# Patient Record
Sex: Female | Born: 1990 | Race: Black or African American | Hispanic: Yes | Marital: Single | Smoking: Never smoker
Health system: Southern US, Community
[De-identification: ages and names within clinical notes are randomized; demographics above are authoritative.]

---
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
## 2009-12-26 ENCOUNTER — Emergency Department (HOSPITAL_COMMUNITY): Admission: EM | Admit: 2009-12-26 | Discharge: 2009-12-26 | Payer: Self-pay | Admitting: Emergency Medicine

## 2011-09-09 LAB — I-STAT 8, (EC8 V) (CONVERTED LAB)
Acid-base deficit: 1
Bicarbonate: 24.7 — ABNORMAL HIGH
Chloride: 105
HCT: 48
Operator id: 161631
TCO2: 26
pCO2, Ven: 43.7 — ABNORMAL LOW

## 2011-09-09 LAB — CBC
HCT: 41.9
Hemoglobin: 14
MCHC: 33.4
RDW: 12.4

## 2011-09-09 LAB — DIFFERENTIAL
Basophils Absolute: 0.1
Eosinophils Relative: 1
Lymphocytes Relative: 14 — ABNORMAL LOW
Monocytes Absolute: 0.7

## 2011-09-09 LAB — URINE MICROSCOPIC-ADD ON

## 2011-09-09 LAB — URINALYSIS, ROUTINE W REFLEX MICROSCOPIC
Glucose, UA: NEGATIVE
Hgb urine dipstick: NEGATIVE
Protein, ur: >300 — AB

## 2015-02-09 ENCOUNTER — Emergency Department (INDEPENDENT_AMBULATORY_CARE_PROVIDER_SITE_OTHER)
Admission: EM | Admit: 2015-02-09 | Discharge: 2015-02-09 | Disposition: A | Discharge status: Home / Self Care | Payer: Self-pay | Source: Home / Self Care | Attending: Family Medicine | Admitting: Family Medicine

## 2015-02-09 ENCOUNTER — Encounter (HOSPITAL_COMMUNITY): Payer: Self-pay | Admitting: Emergency Medicine

## 2015-02-09 DIAGNOSIS — T148 Other injury of unspecified body region: Secondary | ICD-10-CM

## 2015-02-09 DIAGNOSIS — S46811A Strain of other muscles, fascia and tendons at shoulder and upper arm level, right arm, initial encounter: Secondary | ICD-10-CM

## 2015-02-09 DIAGNOSIS — T148XXA Other injury of unspecified body region, initial encounter: Secondary | ICD-10-CM

## 2015-02-09 MED ORDER — CYCLOBENZAPRINE HCL 5 MG PO TABS: 5.0000 mg | ORAL_TABLET | Freq: Every evening | ORAL | Status: AC | PRN

## 2015-02-09 MED ORDER — DICLOFENAC SODIUM 50 MG PO TBEC: 50.0000 mg | DELAYED_RELEASE_TABLET | Freq: Two times a day (BID) | ORAL | Status: AC | PRN
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
## 2015-02-09 NOTE — Discharge Instructions (Signed)
Thank you for coming in today. The flexeril will make you sleepy.   Cervical Strain and Sprain (Whiplash) with Rehab Cervical strain and sprain are injuries that commonly occur with "whiplash" injuries. Whiplash occurs when the neck is forcefully whipped backward or forward, such as during a motor vehicle accident or during contact sports. The muscles, ligaments, tendons, discs, and nerves of the neck are susceptible to injury when this occurs. RISK FACTORS Risk of having a whiplash injury increases if:  Osteoarthritis of the spine.  Situations that make head or neck accidents or trauma more likely.  High-risk sports (football, rugby, wrestling, hockey, auto racing, gymnastics, diving, contact karate, or boxing).  Poor strength and flexibility of the neck.  Previous neck injury.  Poor tackling technique.  Improperly fitted or padded equipment. SYMPTOMS   Pain or stiffness in the front or back of neck or both.  Symptoms may present immediately or up to 24 hours after injury.  Dizziness, headache, nausea, and vomiting.  Muscle spasm with soreness and stiffness in the neck.  Tenderness and swelling at the injury site. PREVENTION  Learn and use proper technique (avoid tackling with the head, spearing, and head-butting; use proper falling techniques to avoid landing on the head).  Warm up and stretch properly before activity.  Maintain physical fitness:  Strength, flexibility, and endurance.  Cardiovascular fitness.  Wear properly fitted and padded protective equipment, such as padded soft collars, for participation in contact sports. PROGNOSIS  Recovery from cervical strain and sprain injuries is dependent on the extent of the injury. These injuries are usually curable in 1 week to 3 months with appropriate treatment.  RELATED COMPLICATIONS   Temporary numbness and weakness may occur if the nerve roots are damaged, and this may persist until the nerve has completely  healed.  Chronic pain due to frequent recurrence of symptoms.  Prolonged healing, especially if activity is resumed too soon (before complete recovery). TREATMENT  Treatment initially involves the use of ice and medication to help reduce pain and inflammation. It is also important to perform strengthening and stretching exercises and modify activities that worsen symptoms so the injury does not get worse. These exercises may be performed at home or with a therapist. For patients who experience severe symptoms, a soft, padded collar may be recommended to be worn around the neck.  Improving your posture may help reduce symptoms. Posture improvement includes pulling your chin and abdomen in while sitting or standing. If you are sitting, sit in a firm chair with your buttocks against the back of the chair. While sleeping, try replacing your pillow with a small towel rolled to 2 inches in diameter, or use a cervical pillow or soft cervical collar. Poor sleeping positions delay healing.  For patients with nerve root damage, which causes numbness or weakness, the use of a cervical traction apparatus may be recommended. Surgery is rarely necessary for these injuries. However, cervical strain and sprains that are present at birth (congenital) may require surgery. MEDICATION   If pain medication is necessary, nonsteroidal anti-inflammatory medications, such as aspirin and ibuprofen, or other minor pain relievers, such as acetaminophen, are often recommended.  Do not take pain medication for 7 days before surgery.  Prescription pain relievers may be given if deemed necessary by your caregiver. Use only as directed and only as much as you need. HEAT AND COLD:   Cold treatment (icing) relieves pain and reduces inflammation. Cold treatment should be applied for 10 to 15 minutes every 2  to 3 hours for inflammation and pain and immediately after any activity that aggravates your symptoms. Use ice packs or an ice  massage.  Heat treatment may be used prior to performing the stretching and strengthening activities prescribed by your caregiver, physical therapist, or athletic trainer. Use a heat pack or a warm soak. SEEK MEDICAL CARE IF:   Symptoms get worse or do not improve in 2 weeks despite treatment.  New, unexplained symptoms develop (drugs used in treatment may produce side effects). EXERCISES RANGE OF MOTION (ROM) AND STRETCHING EXERCISES - Cervical Strain and Sprain These exercises may help you when beginning to rehabilitate your injury. In order to successfully resolve your symptoms, you must improve your posture. These exercises are designed to help reduce the forward-head and rounded-shoulder posture which contributes to this condition. Your symptoms may resolve with or without further involvement from your physician, physical therapist or athletic trainer. While completing these exercises, remember:   Restoring tissue flexibility helps normal motion to return to the joints. This allows healthier, less painful movement and activity.  An effective stretch should be held for at least 20 seconds, although you may need to begin with shorter hold times for comfort.  A stretch should never be painful. You should only feel a gentle lengthening or release in the stretched tissue. STRETCH- Axial Extensors  Lie on your back on the floor. You may bend your knees for comfort. Place a rolled-up hand towel or dish towel, about 2 inches in diameter, under the part of your head that makes contact with the floor.  Gently tuck your chin, as if trying to make a "double chin," until you feel a gentle stretch at the base of your head.  Hold __________ seconds. Repeat __________ times. Complete this exercise __________ times per day.  STRETCH - Axial Extension   Stand or sit on a firm surface. Assume a good posture: chest up, shoulders drawn back, abdominal muscles slightly tense, knees unlocked (if standing)  and feet hip width apart.  Slowly retract your chin so your head slides back and your chin slightly lowers. Continue to look straight ahead.  You should feel a gentle stretch in the back of your head. Be certain not to feel an aggressive stretch since this can cause headaches later.  Hold for __________ seconds. Repeat __________ times. Complete this exercise __________ times per day. STRETCH - Cervical Side Bend   Stand or sit on a firm surface. Assume a good posture: chest up, shoulders drawn back, abdominal muscles slightly tense, knees unlocked (if standing) and feet hip width apart.  Without letting your nose or shoulders move, slowly tip your right / left ear to your shoulder until your feel a gentle stretch in the muscles on the opposite side of your neck.  Hold __________ seconds. Repeat __________ times. Complete this exercise __________ times per day. STRETCH - Cervical Rotators   Stand or sit on a firm surface. Assume a good posture: chest up, shoulders drawn back, abdominal muscles slightly tense, knees unlocked (if standing) and feet hip width apart.  Keeping your eyes level with the ground, slowly turn your head until you feel a gentle stretch along the back and opposite side of your neck.  Hold __________ seconds. Repeat __________ times. Complete this exercise __________ times per day. RANGE OF MOTION - Neck Circles   Stand or sit on a firm surface. Assume a good posture: chest up, shoulders drawn back, abdominal muscles slightly tense, knees unlocked (if standing) and  feet hip width apart.  Gently roll your head down and around from the back of one shoulder to the back of the other. The motion should never be forced or painful.  Repeat the motion 10-20 times, or until you feel the neck muscles relax and loosen. Repeat __________ times. Complete the exercise __________ times per day. STRENGTHENING EXERCISES - Cervical Strain and Sprain These exercises may help you  when beginning to rehabilitate your injury. They may resolve your symptoms with or without further involvement from your physician, physical therapist, or athletic trainer. While completing these exercises, remember:   Muscles can gain both the endurance and the strength needed for everyday activities through controlled exercises.  Complete these exercises as instructed by your physician, physical therapist, or athletic trainer. Progress the resistance and repetitions only as guided.  You may experience muscle soreness or fatigue, but the pain or discomfort you are trying to eliminate should never worsen during these exercises. If this pain does worsen, stop and make certain you are following the directions exactly. If the pain is still present after adjustments, discontinue the exercise until you can discuss the trouble with your clinician. STRENGTH - Cervical Flexors, Isometric  Face a wall, standing about 6 inches away. Place a small pillow, a ball about 6-8 inches in diameter, or a folded towel between your forehead and the wall.  Slightly tuck your chin and gently push your forehead into the soft object. Push only with mild to moderate intensity, building up tension gradually. Keep your jaw and forehead relaxed.  Hold 10 to 20 seconds. Keep your breathing relaxed.  Release the tension slowly. Relax your neck muscles completely before you start the next repetition. Repeat __________ times. Complete this exercise __________ times per day. STRENGTH- Cervical Lateral Flexors, Isometric   Stand about 6 inches away from a wall. Place a small pillow, a ball about 6-8 inches in diameter, or a folded towel between the side of your head and the wall.  Slightly tuck your chin and gently tilt your head into the soft object. Push only with mild to moderate intensity, building up tension gradually. Keep your jaw and forehead relaxed.  Hold 10 to 20 seconds. Keep your breathing relaxed.  Release the  tension slowly. Relax your neck muscles completely before you start the next repetition. Repeat __________ times. Complete this exercise __________ times per day. STRENGTH - Cervical Extensors, Isometric   Stand about 6 inches away from a wall. Place a small pillow, a ball about 6-8 inches in diameter, or a folded towel between the back of your head and the wall.  Slightly tuck your chin and gently tilt your head back into the soft object. Push only with mild to moderate intensity, building up tension gradually. Keep your jaw and forehead relaxed.  Hold 10 to 20 seconds. Keep your breathing relaxed.  Release the tension slowly. Relax your neck muscles completely before you start the next repetition. Repeat __________ times. Complete this exercise __________ times per day. POSTURE AND BODY MECHANICS CONSIDERATIONS - Cervical Strain and Sprain Keeping correct posture when sitting, standing or completing your activities will reduce the stress put on different body tissues, allowing injured tissues a chance to heal and limiting painful experiences. The following are general guidelines for improved posture. Your physician or physical therapist will provide you with any instructions specific to your needs. While reading these guidelines, remember:  The exercises prescribed by your provider will help you have the flexibility and strength to maintain  correct postures.  The correct posture provides the optimal environment for your joints to work. All of your joints have less wear and tear when properly supported by a spine with good posture. This means you will experience a healthier, less painful body.  Correct posture must be practiced with all of your activities, especially prolonged sitting and standing. Correct posture is as important when doing repetitive low-stress activities (typing) as it is when doing a single heavy-load activity (lifting). PROLONGED STANDING WHILE SLIGHTLY LEANING FORWARD When  completing a task that requires you to lean forward while standing in one place for a long time, place either foot up on a stationary 2- to 4-inch high object to help maintain the best posture. When both feet are on the ground, the low back tends to lose its slight inward curve. If this curve flattens (or becomes too large), then the back and your other joints will experience too much stress, fatigue more quickly, and can cause pain.  RESTING POSITIONS Consider which positions are most painful for you when choosing a resting position. If you have pain with flexion-based activities (sitting, bending, stooping, squatting), choose a position that allows you to rest in a less flexed posture. You would want to avoid curling into a fetal position on your side. If your pain worsens with extension-based activities (prolonged standing, working overhead), avoid resting in an extended position such as sleeping on your stomach. Most people will find more comfort when they rest with their spine in a more neutral position, neither too rounded nor too arched. Lying on a non-sagging bed on your side with a pillow between your knees, or on your back with a pillow under your knees will often provide some relief. Keep in mind, being in any one position for a prolonged period of time, no matter how correct your posture, can still lead to stiffness. WALKING Walk with an upright posture. Your ears, shoulders, and hips should all line up. OFFICE WORK When working at a desk, create an environment that supports good, upright posture. Without extra support, muscles fatigue and lead to excessive strain on joints and other tissues. CHAIR:  A chair should be able to slide under your desk when your back makes contact with the back of the chair. This allows you to work closely.  The chair's height should allow your eyes to be level with the upper part of your monitor and your hands to be slightly lower than your elbows.  Body  position:  Your feet should make contact with the floor. If this is not possible, use a foot rest.  Keep your ears over your shoulders. This will reduce stress on your neck and low back. Document Released: 11/11/2005 Document Revised: 03/28/2014 Document Reviewed: 02/23/2009 [facility] Patient Information 2015 [place], [place]. This information is not intended to replace advice given to you by your health care provider. Make sure you discuss any questions you have with your health care provider.    Abrasions An abrasion is a cut or scrape of the skin. Abrasions do not go through all layers of the skin. HOME CARE  If a bandage (dressing) was put on your wound, change it as told by your doctor. If the bandage sticks, soak it off with warm.  Wash the area with water and soap 2 times a day. Rinse off the soap. Pat the area dry with a clean towel.  Put on medicated cream (ointment) as told by your doctor.  Change your bandage right away if  it gets wet or dirty.  Only take medicine as told by your doctor.  See your doctor within 24-48 hours to get your wound checked.  Check your wound for redness, puffiness (swelling), or yellowish-white fluid (pus). GET HELP RIGHT AWAY IF:   You have more pain in the wound.  You have redness, swelling, or tenderness around the wound.  You have pus coming from the wound.  You have a fever or lasting symptoms for more than 2-3 days.  You have a fever and your symptoms suddenly get worse.  You have a bad smell coming from the wound or bandage. MAKE SURE YOU:   Understand these instructions.  Will watch your condition.  Will get help right away if you are not doing well or get worse. Document Released: 04/29/2008 Document Revised: 08/05/2012 Document Reviewed: 10/15/2011 [facility] Patient Information 2015 [place], [place]. This information is not intended to replace advice given to you by your health care provider. Make sure you discuss any  questions you have with your health care provider.
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
## 2018-02-05 DIAGNOSIS — R1314 Dysphagia, pharyngoesophageal phase: Secondary | ICD-10-CM | POA: Insufficient documentation

## 2018-02-05 DIAGNOSIS — E049 Nontoxic goiter, unspecified: Secondary | ICD-10-CM | POA: Insufficient documentation

## 2019-12-15 ENCOUNTER — Ambulatory Visit: Payer: Medicaid Other | Attending: Internal Medicine

## 2019-12-15 DIAGNOSIS — Z20822 Contact with and (suspected) exposure to covid-19: Secondary | ICD-10-CM

## 2019-12-16 ENCOUNTER — Other Ambulatory Visit: Payer: Self-pay

## 2019-12-16 LAB — NOVEL CORONAVIRUS, NAA: SARS-CoV-2, NAA: NOT DETECTED

## 2020-03-02 ENCOUNTER — Ambulatory Visit (INDEPENDENT_AMBULATORY_CARE_PROVIDER_SITE_OTHER): Payer: Medicaid Other | Admitting: Clinical

## 2020-03-02 ENCOUNTER — Other Ambulatory Visit: Payer: Self-pay

## 2020-03-02 DIAGNOSIS — F331 Major depressive disorder, recurrent, moderate: Secondary | ICD-10-CM

## 2020-03-02 DIAGNOSIS — F419 Anxiety disorder, unspecified: Secondary | ICD-10-CM
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
## 2020-03-30 ENCOUNTER — Other Ambulatory Visit: Payer: Self-pay

## 2020-03-30 ENCOUNTER — Ambulatory Visit (INDEPENDENT_AMBULATORY_CARE_PROVIDER_SITE_OTHER): Payer: Medicaid Other | Admitting: Clinical

## 2020-03-30 DIAGNOSIS — F331 Major depressive disorder, recurrent, moderate: Secondary | ICD-10-CM | POA: Diagnosis not present

## 2020-03-30 DIAGNOSIS — F419 Anxiety disorder, unspecified: Secondary | ICD-10-CM | POA: Diagnosis not present

## 2020-03-30 NOTE — Progress Notes (Signed)
Virtual Visit via Video Note  I connected with Colleen Sparks on 03/30/20 at  1:00 PM EDT by a video enabled telemedicine application and verified that I am speaking with the correct person using two identifiers.  Location: Patient: Home Provider: Office   I discussed the limitations of evaluation and management by telemedicine and the availability of in person appointments. The patient expressed understanding and agreed to proceed.      THERAPIST PROGRESS NOTE  Session Time: 1:00PM-1:45PM  Participation Level: Active  Behavioral Response: CasualAlertDepressed  Type of Therapy: Individual Therapy  Treatment Goals addressed: Coping  Interventions: CBT, DBT, Motivational Interviewing, Solution Focused and Supportive  Summary: Colleen Sparks is a 29 y.o. female who presents with Depression and Anxiety The OPT therapist worked with the patient for her initial session. The OPT therapist utilized Motivational Interviewing to assist in creating therapeutic repore. The patient in the session was engaged and work in collaboration giving feedback about her triggers and symptoms over the past few weeks including feeling overwhelmed with trying to manage her child and in home tasks and balancing this with her own needed individual time. The OPT therapist utilized Cognitive Behavioral Therapy through cognitive restructuring as well as worked with the patient on coping strategies to assist in management of mood and anxiety.    Suicidal/Homicidal: Nowithout intent/plan  Therapist Response: The OPT therapist worked with the patient for the patients initial scheduled session. The patient was engaged in her session and gave feedback in relation to triggers, symptoms, and behavior responses over the past few weeks. The OPT therapist worked with the patient utilizing an in session Cognitive Behavioral Therapy exercise. The patient was responsive in the session and verbalized, " I am willing to try to  be more flexible in my task management and try to implement some me time". The OPT therapist will continue treatment work with the patient in her next scheduled session  Plan: Return again in 2/3 weeks.  Diagnosis: Axis I:Recurrent moderate major depressive disorder with anxiety     Axis II: No diagnosis  I discussed the assessment and treatment plan with the patient. The patient was provided an opportunity to ask questions and all were answered. The patient agreed with the plan and demonstrated an understanding of the instructions.   The patient was advised to call back or seek an in-person evaluation if the symptoms worsen or if the condition fails to improve as anticipated.  I provided 45 minutes of non-face-to-face time during this encounter.  Winfred Burn, LCSW 03/30/2020

## 2020-07-24 ENCOUNTER — Other Ambulatory Visit: Payer: Self-pay | Admitting: Internal Medicine

## 2020-07-24 DIAGNOSIS — E042 Nontoxic multinodular goiter: Secondary | ICD-10-CM

## 2020-08-01 ENCOUNTER — Ambulatory Visit
Admission: RE | Admit: 2020-08-01 | Discharge: 2020-08-01 | Disposition: A | Discharge status: Home / Self Care | Payer: Medicaid Other | Source: Ambulatory Visit | Attending: Internal Medicine | Admitting: Internal Medicine

## 2020-08-01 ENCOUNTER — Other Ambulatory Visit: Payer: Self-pay | Admitting: Internal Medicine

## 2020-08-01 DIAGNOSIS — E042 Nontoxic multinodular goiter: Secondary | ICD-10-CM

## 2020-08-28 ENCOUNTER — Other Ambulatory Visit: Payer: Medicaid Other

## 2020-08-28 DIAGNOSIS — Z20822 Contact with and (suspected) exposure to covid-19: Secondary | ICD-10-CM

## 2020-08-29 LAB — NOVEL CORONAVIRUS, NAA: SARS-CoV-2, NAA: NOT DETECTED

## 2020-08-29 LAB — SARS-COV-2, NAA 2 DAY TAT

## 2021-11-01 IMAGING — US US THYROID
1 series · 13 of 25 positions shown · non-contrast
Comparison: 07/19/2020 and previous

CLINICAL DATA: Bilateral nodules suspected on recent ultrasound.

EXAM:
THYROID ULTRASOUND
TECHNIQUE: Ultrasound examination of the thyroid gland and adjacent soft
tissues was performed in contemplation of FNA biopsy x2.

[Series 1: us thyroid · 0.06mm/px · 13 of 54 slices shown]
[im 1/54]
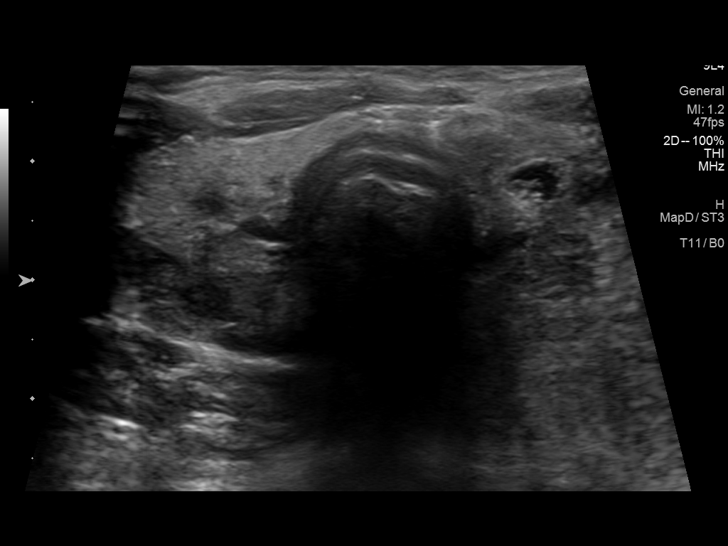
[im 5/54]
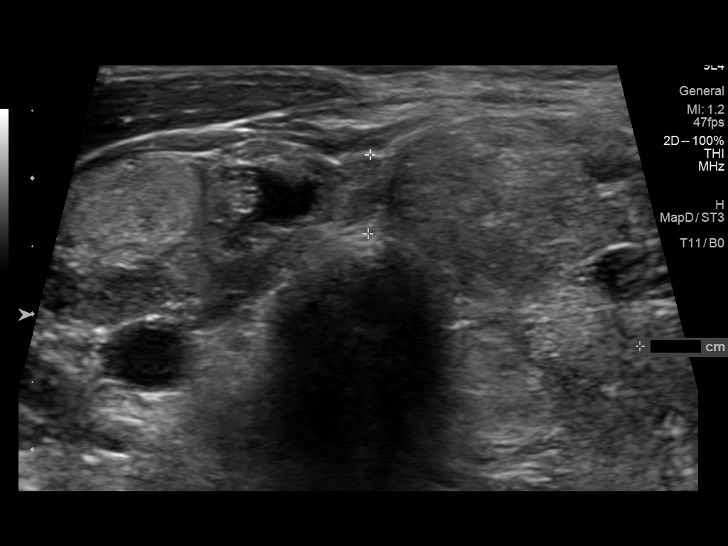
[im 9/54]
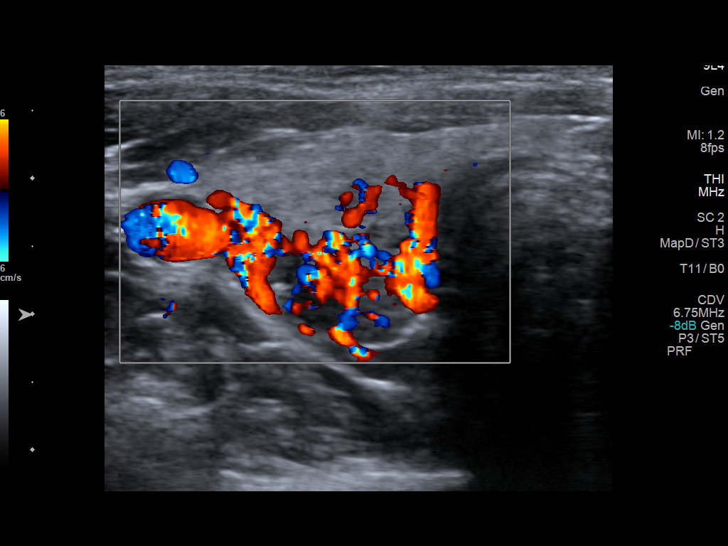
[im 14/54]
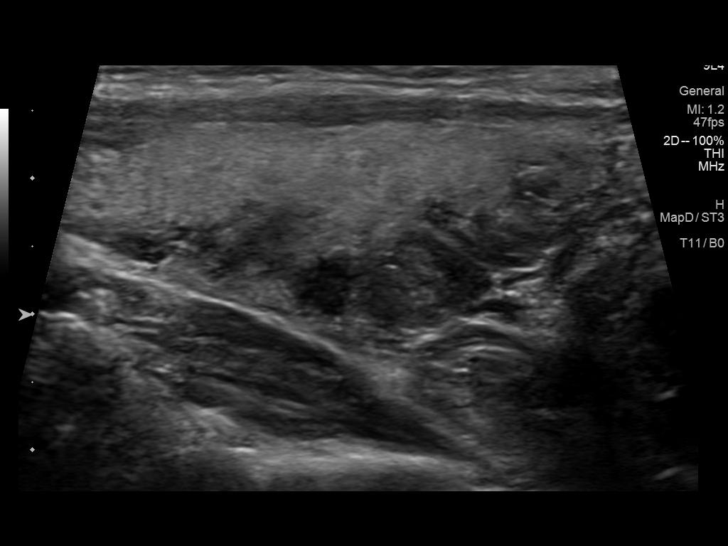
[im 18/54]
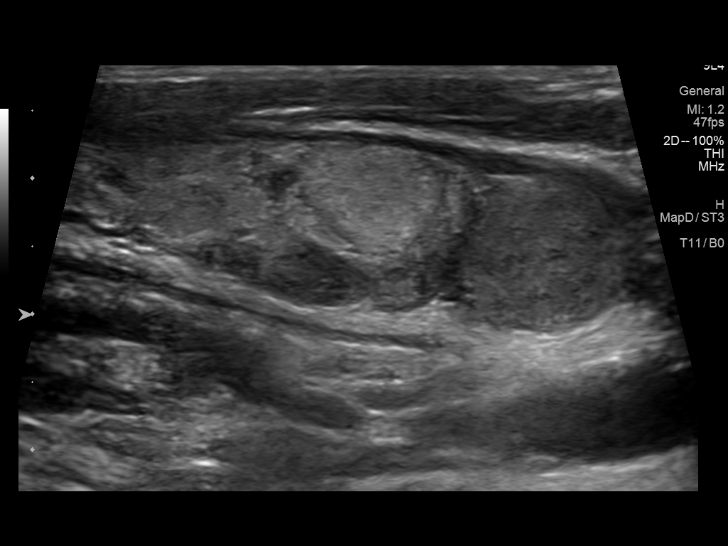
[im 23/54]
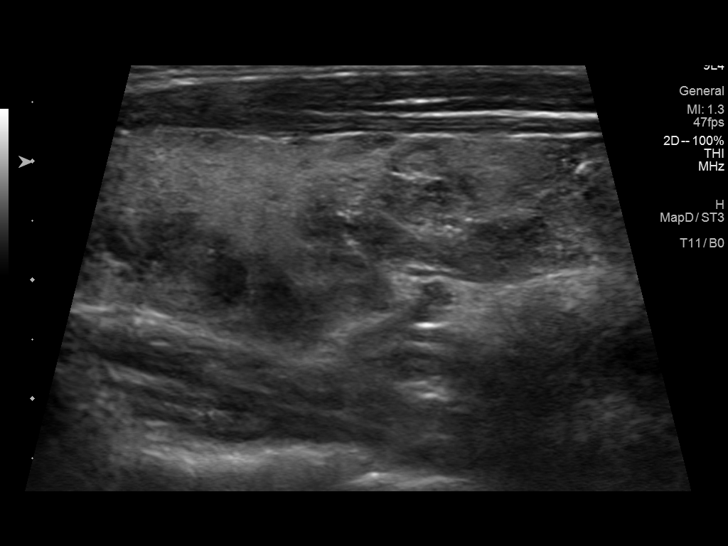
[im 27/54]
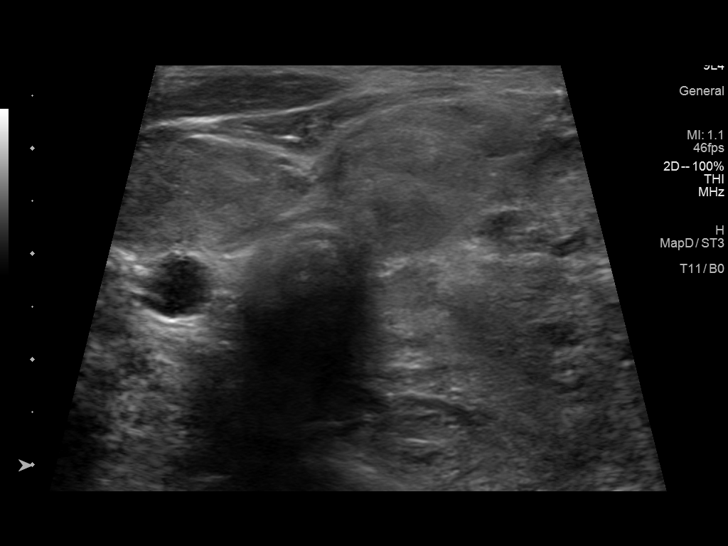
[im 31/54]
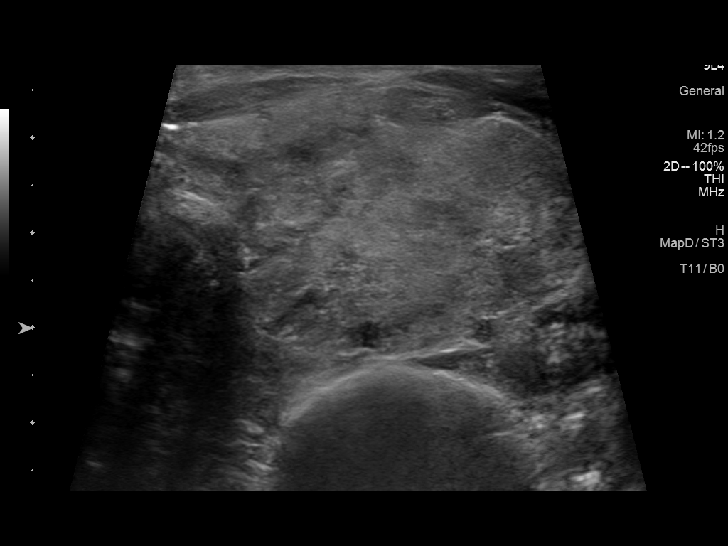
[im 36/54]
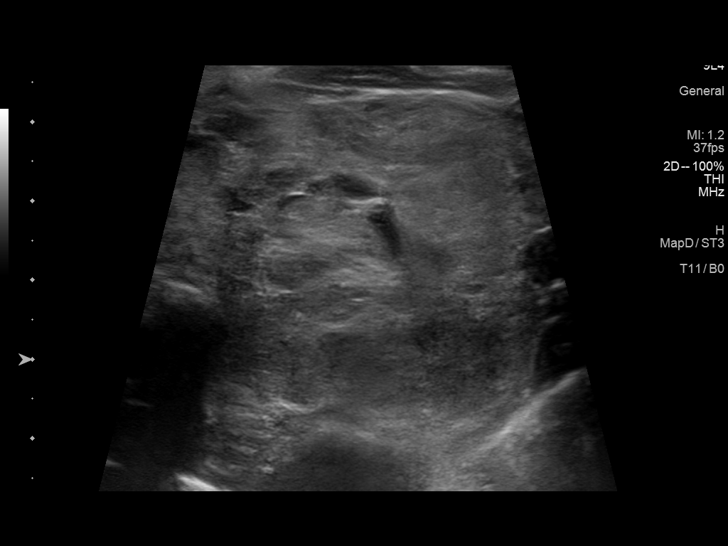
[im 40/54]
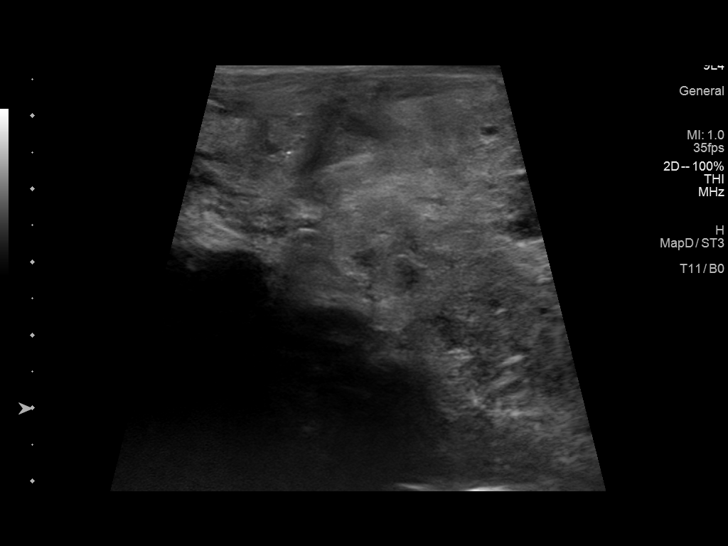
[im 45/54]
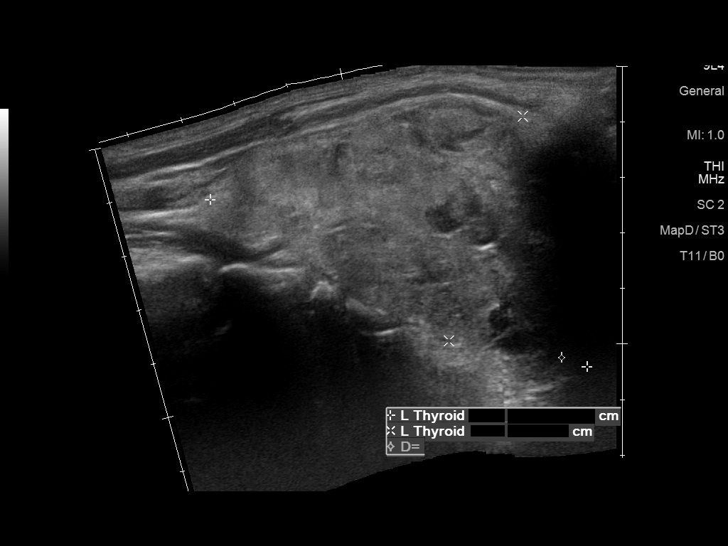
[im 49/54]
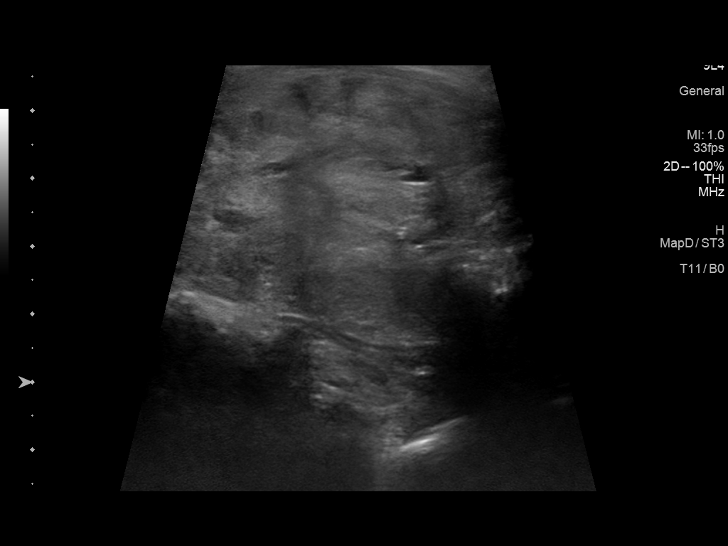
[im 54/54]
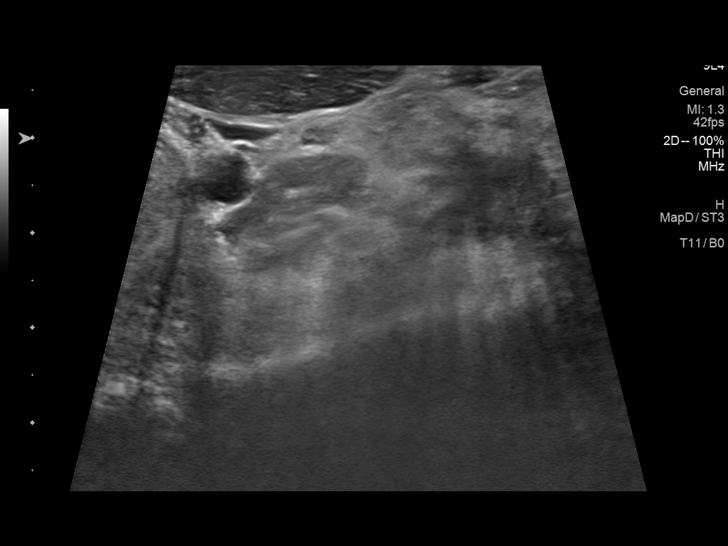

[13 of 25 positions shown; findings below may reference images not displayed]

FINDINGS: Parenchymal Echotexture: Moderately heterogenous, left greater than
right

Isthmus: 0.6 cm thickness

Right lobe: 6.7 x 1.9 x 2 cm, previously 5 x 1.9 x

Left lobe: 7.4 x 4.2 x 4 cm, previously 5.6 x 3.6 x

_________________________________________________________

Estimated total number of nodules >/= 1 cm: 0

Number of spongiform nodules >/=  2 cm not described below (TR1): 0

Number of mixed cystic and solid nodules >/= 1.5 cm not described
below (TR2): 0

_________________________________________________________

There is a poorly marginated hypoechoic band with hyperemia in the
posterior aspect of the right lobe but no discrete nodule to
correlate to the suspicion on prior study.

Asymmetric enlargement of the left lobe as before, markedly
heterogenous without discrete mass or nodule.
IMPRESSION: 1. No convincing discrete nodule or mass in either lobe. Therefore,
FNA biopsy was deferred.
2. Recommend continued annual/biennial ultrasound follow-up
surveillance, until stability x5 years confirmed.

The above is in keeping with the ACR TI-RADS recommendations - [HOSPITAL] 7007;[DATE].

## 2022-12-16 DIAGNOSIS — R221 Localized swelling, mass and lump, neck: Secondary | ICD-10-CM | POA: Insufficient documentation

## 2023-12-27 ENCOUNTER — Other Ambulatory Visit: Payer: Self-pay

## 2023-12-27 ENCOUNTER — Encounter (HOSPITAL_BASED_OUTPATIENT_CLINIC_OR_DEPARTMENT_OTHER): Payer: Self-pay

## 2023-12-27 DIAGNOSIS — S50312A Abrasion of left elbow, initial encounter: Secondary | ICD-10-CM | POA: Diagnosis not present

## 2023-12-27 DIAGNOSIS — S39012A Strain of muscle, fascia and tendon of lower back, initial encounter: Secondary | ICD-10-CM | POA: Insufficient documentation

## 2023-12-27 DIAGNOSIS — S60221A Contusion of right hand, initial encounter: Secondary | ICD-10-CM | POA: Diagnosis not present

## 2023-12-27 DIAGNOSIS — M545 Low back pain, unspecified: Secondary | ICD-10-CM | POA: Diagnosis present

## 2023-12-27 DIAGNOSIS — S80212A Abrasion, left knee, initial encounter: Secondary | ICD-10-CM | POA: Insufficient documentation
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
## 2023-12-28 ENCOUNTER — Emergency Department (HOSPITAL_BASED_OUTPATIENT_CLINIC_OR_DEPARTMENT_OTHER)
Admission: EM | Admit: 2023-12-28 | Discharge: 2023-12-28 | Disposition: A | Discharge status: Home / Self Care | Payer: Medicaid Other | Attending: Emergency Medicine | Admitting: Emergency Medicine

## 2023-12-28 ENCOUNTER — Emergency Department (HOSPITAL_BASED_OUTPATIENT_CLINIC_OR_DEPARTMENT_OTHER): Payer: Medicaid Other | Admitting: Radiology

## 2023-12-28 DIAGNOSIS — S39012A Strain of muscle, fascia and tendon of lower back, initial encounter: Secondary | ICD-10-CM

## 2023-12-28 DIAGNOSIS — S50312A Abrasion of left elbow, initial encounter: Secondary | ICD-10-CM

## 2023-12-28 DIAGNOSIS — S80212A Abrasion, left knee, initial encounter: Secondary | ICD-10-CM

## 2023-12-28 DIAGNOSIS — S60221A Contusion of right hand, initial encounter: Secondary | ICD-10-CM

## 2023-12-28 MED ORDER — IBUPROFEN 200 MG PO TABS
600.0000 mg | ORAL_TABLET | Freq: Once | ORAL | Status: AC
  Administered 2023-12-28: 600 mg via ORAL
  Filled 2023-12-28: qty 1
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
## 2023-12-28 NOTE — Discharge Instructions (Addendum)
Apply ice to sore areas.  Ice should be applied for 30 minutes at a time, 4 times a day.  Take ibuprofen or naproxen as needed for pain.  If you need additional pain relief, add acetaminophen.  When you combine acetaminophen with either ibuprofen or naproxen, you will get better pain relief and you get from taking either medication by itself.

## 2024-07-04 ENCOUNTER — Emergency Department (HOSPITAL_COMMUNITY)
Admission: EM | Admit: 2024-07-04 | Discharge: 2024-07-05 | Disposition: A | Discharge status: Home / Self Care | Attending: Emergency Medicine | Admitting: Emergency Medicine

## 2024-07-04 ENCOUNTER — Emergency Department (HOSPITAL_COMMUNITY)

## 2024-07-04 DIAGNOSIS — N760 Acute vaginitis: Secondary | ICD-10-CM | POA: Insufficient documentation

## 2024-07-04 DIAGNOSIS — B9689 Other specified bacterial agents as the cause of diseases classified elsewhere: Secondary | ICD-10-CM | POA: Insufficient documentation

## 2024-07-04 DIAGNOSIS — S97112A Crushing injury of left great toe, initial encounter: Secondary | ICD-10-CM | POA: Insufficient documentation

## 2024-07-04 DIAGNOSIS — W228XXA Striking against or struck by other objects, initial encounter: Secondary | ICD-10-CM | POA: Insufficient documentation

## 2024-07-04 DIAGNOSIS — S90932A Unspecified superficial injury of left great toe, initial encounter: Secondary | ICD-10-CM | POA: Diagnosis present

## 2024-07-04 DIAGNOSIS — S91109A Unspecified open wound of unspecified toe(s) without damage to nail, initial encounter: Secondary | ICD-10-CM

## 2024-07-04 DIAGNOSIS — N898 Other specified noninflammatory disorders of vagina: Secondary | ICD-10-CM

## 2024-07-04 LAB — WET PREP, GENITAL
Sperm: NONE SEEN
Trich, Wet Prep: NONE SEEN
WBC, Wet Prep HPF POC: <10 (ref <10)
Yeast Wet Prep HPF POC: NONE SEEN

## 2024-07-04 LAB — POC URINE PREG, ED: Preg Test, Ur: NEGATIVE

## 2024-07-04 MED ORDER — METRONIDAZOLE 500 MG PO TABS: 500.0000 mg | ORAL_TABLET | Freq: Two times a day (BID) | ORAL | 0 refills | Status: AC

## 2024-07-04 MED ORDER — LIDOCAINE HCL (PF) 1 % IJ SOLN
5.0000 mL | Freq: Once | INTRAMUSCULAR | Status: AC
  Administered 2024-07-04: 5 mL
  Filled 2024-07-04: qty 30

## 2024-07-04 MED ORDER — KETOROLAC TROMETHAMINE 15 MG/ML IJ SOLN
15.0000 mg | Freq: Once | INTRAMUSCULAR | Status: DC
  Filled 2024-07-04: qty 1

## 2024-07-04 NOTE — ED Triage Notes (Addendum)
 Patient arrived stating she opened a drawer and it partly lifted her great toenail on her left foot. Attempted to soak foot and self remove it but with no success.

## 2024-07-04 NOTE — ED Provider Notes (Signed)
  [place] EMERGENCY DEPARTMENT AT [facility] Provider Note   CSN: [id number] Arrival date & time: 07/04/24  2156     Patient presents with: Nail Problem   Colleen Sparks is a 33 y.o. female.  Pulled freezer door out and hit toe  Pink discharge this morning. No odor. Thought felt  string on thigh 2 days ago Cramps and bloating.   {Add pertinent medical, surgical, social history, OB history to HPI:32947} HPI     Prior to Admission medications   Medication Sig Start Date End Date Taking? Authorizing Provider  cyclobenzaprine  (FLEXERIL ) 5 MG tablet Take 1 tablet (5 mg total) by mouth at bedtime as needed for muscle spasms. 02/09/15   Corey, Evan S, MD  diclofenac  (VOLTAREN ) 50 MG EC tablet Take 1 tablet (50 mg total) by mouth 2 (two) times daily as needed. 02/09/15   Corey, Evan S, MD    Allergies: Patient has no known allergies.    Review of Systems  Updated Vital Signs BP 129/74   Pulse 76   Temp 98.1 F (36.7 C) (Oral)   Resp 20   SpO2 99%   Physical Exam  (all labs ordered are listed, but only abnormal results are displayed) Labs Reviewed - No data to display  EKG: None  Radiology: No results found.  {Document cardiac monitor, telemetry assessment procedure when appropriate:32947} Procedures   Medications Ordered in the ED - No data to display    {Click here for ABCD2, HEART and other calculators REFRESH Note before signing:1}                              Medical Decision Making  ***  {Document critical care time when appropriate  Document review of labs and clinical decision tools ie CHADS2VASC2, etc  Document your independent review of radiology images and any outside records  Document your discussion with family members, caretakers and with consultants  Document social determinants of health affecting pt's care  Document your decision making why or why not admission, treatments were needed:32947:::1}   Final diagnoses:  None     ED Discharge Orders     None

## 2024-07-04 NOTE — Discharge Instructions (Addendum)
 Thank you for letting us  evaluate you today.  You tested positive for BV.  I have sent antibiotic to your pharmacy to treat for this.  This is not an STD but a imbalance of bacteria.  Your STD screening will result in 24-48 hours  Your toe did not show any signs of fracture.  We have cleaned out your toe wound.  Please follow-up with podiatry for further management.  You may use Tylenol , ibuprofen  as needed for pain.  Follow up with GYN for further management. No current pregnancy.

## 2024-07-05 ENCOUNTER — Telehealth: Payer: Self-pay | Admitting: Podiatry

## 2024-07-05 LAB — GC/CHLAMYDIA PROBE AMP (~~LOC~~) NOT AT ARMC
Chlamydia: POSITIVE — AB
Comment: NEGATIVE
Comment: NORMAL
Neisseria Gonorrhea: NEGATIVE

## 2024-07-05 LAB — URINALYSIS, ROUTINE W REFLEX MICROSCOPIC
Bacteria, UA: NONE SEEN
Bilirubin Urine: NEGATIVE
Glucose, UA: NEGATIVE mg/dL
Ketones, ur: NEGATIVE mg/dL
Leukocytes,Ua: NEGATIVE
Nitrite: NEGATIVE
Protein, ur: NEGATIVE mg/dL
Specific Gravity, Urine: 1.029 (ref 1.005–1.030)
pH: 5 (ref 5.0–8.0)

## 2024-07-05 MED ORDER — OXYCODONE-ACETAMINOPHEN 5-325 MG PO TABS
1.0000 | ORAL_TABLET | Freq: Once | ORAL | Status: AC
  Administered 2024-07-05 (×2): 1 via ORAL
  Filled 2024-07-05: qty 1

## 2024-07-05 NOTE — Telephone Encounter (Signed)
 Left message for patient to contact office with more information about what is going on with her toe.

## 2024-07-07 ENCOUNTER — Ambulatory Visit (INDEPENDENT_AMBULATORY_CARE_PROVIDER_SITE_OTHER): Admitting: Podiatry

## 2024-07-07 ENCOUNTER — Telehealth (HOSPITAL_COMMUNITY): Payer: Self-pay

## 2024-07-07 ENCOUNTER — Ambulatory Visit (HOSPITAL_COMMUNITY): Payer: Self-pay

## 2024-07-07 DIAGNOSIS — L600 Ingrowing nail: Secondary | ICD-10-CM | POA: Diagnosis not present

## 2024-07-07 MED ORDER — DOXYCYCLINE HYCLATE 100 MG PO CAPS
100.0000 mg | ORAL_CAPSULE | Freq: Two times a day (BID) | ORAL | 0 refills | Status: AC
Start: 2024-07-07 — End: 2024-07-14

## 2024-07-07 NOTE — Telephone Encounter (Signed)
 This patient tested positive for Chlamydia   She has NKDA I have informed the patient of her results and confirmed her pharmacy is correct in her chart. Treatment has been sent per protocol.   Arlander Katheryn RAMAN, RN

## 2024-07-07 NOTE — Progress Notes (Signed)
  Subjective:  Patient ID: Colleen Sparks, female    DOB: Dec 03, 1990,  MRN: [id number]  Chief Complaint  Patient presents with   Nail Problem    Left hallux nail injury pt stated that she went to the hospital and they trimmed the corners and cleaned it     33 y.o. female presents with the above complaint.  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  Denies any other acute complaints.   Review of Systems: Negative except as noted in the HPI. Denies N/V/F/Ch.  No past medical history on file.  Current Outpatient Medications:    cyclobenzaprine  (FLEXERIL ) 5 MG tablet, Take 1 tablet (5 mg total) by mouth at bedtime as needed for muscle spasms., Disp: 20 tablet, Rfl: 0   diclofenac  (VOLTAREN ) 50 MG EC tablet, Take 1 tablet (50 mg total) by mouth 2 (two) times daily as needed., Disp: 30 tablet, Rfl: 0   doxycycline  (VIBRAMYCIN ) 100 MG capsule, Take 1 capsule (100 mg total) by mouth 2 (two) times daily for 7 days., Disp: 14 capsule, Rfl: 0   metroNIDAZOLE  (FLAGYL ) 500 MG tablet, Take 1 tablet (500 mg total) by mouth 2 (two) times daily., Disp: 14 tablet, Rfl: 0  Social History   Tobacco Use  Smoking Status Never  Smokeless Tobacco Not on file    No Known Allergies Objective:  There were no vitals filed for this visit. There is no height or weight on file to calculate BMI. Constitutional Well developed. Well nourished.  Vascular Dorsalis pedis pulses palpable bilaterally. Posterior tibial pulses palpable bilaterally. Capillary refill normal to all digits.  No cyanosis or clubbing noted. Pedal hair growth normal.  Neurologic Normal speech. Oriented to person, place, and time. Epicritic sensation to light touch grossly present bilaterally.  Dermatologic Pain on palpation of the entire/total nail on 1st  digit of the left No other open wounds. No skin lesions.  Orthopedic: Normal joint ROM without pain or crepitus bilaterally. No visible deformities. No bony tenderness.   Radiographs: None Assessment:   1. Ingrown left big toenail    Plan:  Patient was evaluated and treated and all questions answered.  Nail contusion/dystrophy hallux, left -Patient elects to proceed with minor surgery to remove entire toenail today. Consent reviewed and signed by patient. -Entire/total nail excised. See procedure note. -Educated on post-procedure care including soaking. Written instructions provided and reviewed. -Patient to follow up in 2 weeks for nail check.  Procedure: Excision of entire/total nail  Location: Left 1st toe digit Anesthesia: Lidocaine  1% plain; 1.5 mL and Marcaine 0.5% plain; 1.5 mL, digital block. Skin Prep: Betadine. Dressing: Silvadene; telfa; dry, sterile, compression dressing. Technique: Following skin prep, the toe was exsanguinated and a tourniquet was secured at the base of the toe. The affected nail border was freed and excised. The tourniquet was then removed and sterile dressing applied. Disposition: Patient tolerated procedure well. Patient to return in 2 weeks for follow-up.   No follow-ups on file.

## 2024-07-08 ENCOUNTER — Ambulatory Visit: Admitting: Podiatry

## 2024-07-09 ENCOUNTER — Ambulatory Visit (INDEPENDENT_AMBULATORY_CARE_PROVIDER_SITE_OTHER): Admitting: Podiatry

## 2024-07-09 DIAGNOSIS — L600 Ingrowing nail: Secondary | ICD-10-CM

## 2024-07-09 NOTE — Progress Notes (Unsigned)
 Gauze was removed.  No complication noted.  Encouraged her to do soap and Neosporin and a Band-Aid

## 2024-07-14 ENCOUNTER — Ambulatory Visit: Admitting: Podiatry
